# Patient Record
Sex: Female | Born: 1960 | Race: White | Hispanic: No | Marital: Single | Smoking: Never smoker
Health system: Southern US, Community
[De-identification: ages and names within clinical notes are randomized; demographics above are authoritative.]

## PROBLEM LIST (undated history)

## (undated) DIAGNOSIS — E079 Disorder of thyroid, unspecified: Secondary | ICD-10-CM

## (undated) DIAGNOSIS — I10 Essential (primary) hypertension: Secondary | ICD-10-CM

## (undated) HISTORY — PX: BREAST SURGERY: SHX581

## (undated) HISTORY — PX: ABDOMINAL HYSTERECTOMY: SHX81

## (undated) HISTORY — PX: SEPTOPLASTY: SHX2393

---

## 2019-07-09 ENCOUNTER — Other Ambulatory Visit: Payer: Self-pay

## 2019-07-09 ENCOUNTER — Encounter (HOSPITAL_BASED_OUTPATIENT_CLINIC_OR_DEPARTMENT_OTHER): Payer: Self-pay | Admitting: *Deleted

## 2019-07-09 ENCOUNTER — Emergency Department (HOSPITAL_BASED_OUTPATIENT_CLINIC_OR_DEPARTMENT_OTHER): Payer: Medicare HMO

## 2019-07-09 ENCOUNTER — Emergency Department (HOSPITAL_BASED_OUTPATIENT_CLINIC_OR_DEPARTMENT_OTHER)
Admission: EM | Admit: 2019-07-09 | Discharge: 2019-07-09 | Disposition: A | Discharge status: Home / Self Care | Payer: Medicare HMO | Attending: Emergency Medicine | Admitting: Emergency Medicine

## 2019-07-09 DIAGNOSIS — Z79899 Other long term (current) drug therapy: Secondary | ICD-10-CM | POA: Insufficient documentation

## 2019-07-09 DIAGNOSIS — I10 Essential (primary) hypertension: Secondary | ICD-10-CM | POA: Insufficient documentation

## 2019-07-09 DIAGNOSIS — J029 Acute pharyngitis, unspecified: Secondary | ICD-10-CM | POA: Diagnosis not present

## 2019-07-09 DIAGNOSIS — Z888 Allergy status to other drugs, medicaments and biological substances status: Secondary | ICD-10-CM | POA: Diagnosis not present

## 2019-07-09 DIAGNOSIS — E860 Dehydration: Secondary | ICD-10-CM | POA: Diagnosis not present

## 2019-07-09 DIAGNOSIS — R110 Nausea: Secondary | ICD-10-CM | POA: Diagnosis not present

## 2019-07-09 DIAGNOSIS — R197 Diarrhea, unspecified: Secondary | ICD-10-CM | POA: Diagnosis present

## 2019-07-09 DIAGNOSIS — E079 Disorder of thyroid, unspecified: Secondary | ICD-10-CM | POA: Diagnosis not present

## 2019-07-09 DIAGNOSIS — Z7982 Long term (current) use of aspirin: Secondary | ICD-10-CM | POA: Insufficient documentation

## 2019-07-09 DIAGNOSIS — Z881 Allergy status to other antibiotic agents status: Secondary | ICD-10-CM | POA: Insufficient documentation

## 2019-07-09 HISTORY — DX: Disorder of thyroid, unspecified: E07.9

## 2019-07-09 HISTORY — DX: Essential (primary) hypertension: I10

## 2019-07-09 LAB — CBC WITH DIFFERENTIAL/PLATELET
Abs Immature Granulocytes: 0.03 10*3/uL (ref 0.00–0.07)
Basophils Absolute: 0.1 10*3/uL (ref 0.0–0.1)
Basophils Relative: 1 %
Eosinophils Absolute: 0.2 10*3/uL (ref 0.0–0.5)
Eosinophils Relative: 4 %
HCT: 47.1 % — ABNORMAL HIGH (ref 36.0–46.0)
Hemoglobin: 15 g/dL (ref 12.0–15.0)
Immature Granulocytes: 1 %
Lymphocytes Relative: 21 %
Lymphs Abs: 1.2 10*3/uL (ref 0.7–4.0)
MCH: 29.6 pg (ref 26.0–34.0)
MCHC: 31.8 g/dL (ref 30.0–36.0)
MCV: 92.9 fL (ref 80.0–100.0)
Monocytes Absolute: 0.5 10*3/uL (ref 0.1–1.0)
Monocytes Relative: 10 %
Neutro Abs: 3.7 10*3/uL (ref 1.7–7.7)
Neutrophils Relative %: 63 %
Platelets: 174 10*3/uL (ref 150–400)
RBC: 5.07 MIL/uL (ref 3.87–5.11)
RDW: 14.6 % (ref 11.5–15.5)
WBC: 5.7 10*3/uL (ref 4.0–10.5)
nRBC: 0 % (ref 0.0–0.2)

## 2019-07-09 LAB — COMPREHENSIVE METABOLIC PANEL
ALT: 26 U/L (ref 0–44)
AST: 30 U/L (ref 15–41)
Albumin: 4.3 g/dL (ref 3.5–5.0)
Alkaline Phosphatase: 98 U/L (ref 38–126)
Anion gap: 9 (ref 5–15)
BUN: 12 mg/dL (ref 6–20)
CO2: 24 mmol/L (ref 22–32)
Calcium: 9.4 mg/dL (ref 8.9–10.3)
Chloride: 111 mmol/L (ref 98–111)
Creatinine, Ser: 0.95 mg/dL (ref 0.44–1.00)
GFR calc Af Amer: >60 mL/min (ref >60)
GFR calc non Af Amer: >60 mL/min (ref >60)
Glucose, Bld: 73 mg/dL (ref 70–99)
Potassium: 3.8 mmol/L (ref 3.5–5.1)
Sodium: 144 mmol/L (ref 135–145)
Total Bilirubin: 0.5 mg/dL (ref 0.3–1.2)
Total Protein: 7.1 g/dL (ref 6.5–8.1)

## 2019-07-09 LAB — LIPASE, BLOOD: Lipase: 28 U/L (ref 11–51)

## 2019-07-09 MED ORDER — PROMETHAZINE HCL 25 MG/ML IJ SOLN
25.0000 mg | Freq: Once | INTRAMUSCULAR | Status: AC
  Administered 2019-07-09: 25 mg via INTRAMUSCULAR
  Filled 2019-07-09: qty 1

## 2019-07-09 MED ORDER — PROMETHAZINE HCL 25 MG/ML IJ SOLN
12.5000 mg | Freq: Once | INTRAMUSCULAR | Status: AC
  Administered 2019-07-09: 21:00:00 12.5 mg via INTRAMUSCULAR
  Filled 2019-07-09: qty 1

## 2019-07-09 MED ORDER — SODIUM CHLORIDE 0.9 % IV BOLUS
1000.0000 mL | Freq: Once | INTRAVENOUS | Status: AC
  Administered 2019-07-09: 19:00:00 1000 mL via INTRAVENOUS

## 2019-07-09 NOTE — ED Provider Notes (Signed)
[facility] EMERGENCY DEPARTMENT Provider Note   CSN: [id number] Arrival date & time: 07/09/19  1746     History   Chief Complaint Chief Complaint  Patient presents with  . Diarrhea    HPI Rebecca Stein is a 58 y.o. female.     The history is provided by the patient and medical records. No language interpreter was used.  Diarrhea  Rebecca Stein is a 58 y.o. female who presents to the Emergency Department complaining of nausea and diarrhea. She presents to the ED complaining of four days of scratchy throat, nausea and diarrhea. No reports of fevers. She complains of watery stools up to 16 stools today. She also has a right-sided abdominal pain. She does have a history of recurrent right-sided abdominal pain and has been worked up for biliary problems in the past. She states that she had a possible COVID-19 exposure from her fianc. She just learned yesterday that he had a positive antibody on a test performed one month ago. Otherwise no clear COVID-19 exposures. She went to urgent care today and had a COVID swab performed, results are not back yet. She was referred to the emergency department for possible dehydration. She denies any chest pain. She has minimal cough. No vomiting. She has very poor appetite. Past Medical History:  Diagnosis Date  . Hypertension   . Thyroid disease     There are no active problems to display for this patient.   Past Surgical History:  Procedure Laterality Date  . ABDOMINAL HYSTERECTOMY    . BREAST SURGERY    . SEPTOPLASTY       OB History   No obstetric history on file.      Home Medications    Prior to Admission medications   Medication Sig Start Date End Date Taking? Authorizing Provider  alendronate (FOSAMAX) 70 MG tablet Take by mouth. 12/30/18  Yes [provider]  amLODipine (NORVASC) 2.5 MG tablet Take by mouth. 12/30/18  Yes [provider]  atorvastatin (LIPITOR) 80 MG tablet TAKE 1/2 TABLET EVERY DAY  12/30/18  Yes [provider]  celecoxib (CELEBREX) 200 MG capsule TAKE 1 CAPSULE TWICE DAILY 12/30/18  Yes [provider]  colchicine 0.6 MG tablet Take by mouth. 12/30/18  Yes [provider]  metoprolol succinate (TOPROL-XL) 25 MG 24 hr tablet TAKE 1 TABLET TWICE DAILY 05/27/14  Yes [provider]  nortriptyline (PAMELOR) 10 MG capsule TAKE 1 TO 3 CAPSULES NIGHTLY AS NEEDED 12/30/18  Yes [provider]  oxyCODONE-acetaminophen (PERCOCET) 10-325 MG tablet Take by mouth. 06/27/19  Yes [provider]  SUMAtriptan 6 MG/0.5ML SOAJ INJECT 0.5MLS (6MG  TOTAL) INTO THE SKIN EVERY 2 (TWO) HOURS AS NEEDED FOR MIGRAINE DO NOT EXCEED 2 DOSES IN A DAY 12/30/18  Yes [provider]  topiramate (TOPAMAX) 50 MG tablet Take by mouth. 12/30/18  Yes [provider]  venlafaxine XR (EFFEXOR-XR) 150 MG 24 hr capsule TAKE 1 CAPSULE EVERY DAY 12/30/18  Yes [provider]  aspirin 81 MG chewable tablet Chew by mouth.    [provider]  cyclobenzaprine (FLEXERIL) 5 MG tablet Take by mouth.    [provider]  esomeprazole (NEXIUM) 20 MG capsule Take by mouth.    [provider]  gabapentin (NEURONTIN) 300 MG capsule Take by mouth.    [provider]  LYSINE PO Take by mouth.    [provider]  rOPINIRole (REQUIP) 0.5 MG tablet Take by mouth.  [provider]    Family History History reviewed. No pertinent family history.  Social History Social History   Tobacco Use  . Smoking status: Never Smoker  . Smokeless tobacco: Never Used  Substance Use Topics  . Alcohol use: Not Currently  . Drug use: Not Currently     Allergies   Doxycycline and Ondansetron   Review of Systems Review of Systems  Gastrointestinal: Positive for diarrhea.  All other systems reviewed and are negative.    Physical Exam Updated Vital Signs BP (!) 145/95 (BP Location: Right Arm)   Pulse 94    Temp 98 F (36.7 C) (Oral)   Resp 18   Ht 5\' 6"  (1.676 m)   Wt 97.5 kg   SpO2 99%   BMI 34.70 kg/m   Physical Exam Vitals signs and nursing note reviewed.  Constitutional:      Appearance: She is well-developed.  HENT:     Head: Normocephalic and atraumatic.  Cardiovascular:     Rate and Rhythm: Regular rhythm. Tachycardia present.  Pulmonary:     Effort: Pulmonary effort is normal. No respiratory distress.     Breath sounds: Normal breath sounds.  Abdominal:     Palpations: Abdomen is soft.     Tenderness: There is no guarding or rebound.     Comments: Mild right sided abdominal tenderness  Musculoskeletal:        General: No swelling or tenderness.  Skin:    General: Skin is warm and dry.     Capillary Refill: Capillary refill takes less than 2 seconds.  Neurological:     Mental Status: She is alert and oriented to person, place, and time.  Psychiatric:        Behavior: Behavior normal.      ED Treatments / Results  Labs (all labs ordered are listed, but only abnormal results are displayed) Labs Reviewed  CBC WITH DIFFERENTIAL/PLATELET - Abnormal; Notable for the following components:      Result Value   HCT 47.1 (*)    All other components within normal limits  COMPREHENSIVE METABOLIC PANEL  LIPASE, BLOOD  URINALYSIS, ROUTINE W REFLEX MICROSCOPIC    EKG EKG Interpretation  Date/Time:  Thursday July 09 2019 19:17:04 EDT Ventricular Rate:  92 PR Interval:    QRS Duration: 85 QT Interval:  353 QTC Calculation: 437 R Axis:   9 Text Interpretation:  Sinus rhythm Low voltage, precordial leads Probable anteroseptal infarct, old no prior available for comparison Confirmed by Tilden Fossaees, Lancer Thurner [phone]([id number]) on 07/09/2019 7:35:09 PM   Radiology Dg Chest Port 1 View  Result Date: 07/09/2019 CLINICAL DATA:  Right-sided chest pain and cough. Nausea and diarrhea for 1 week. EXAM: PORTABLE CHEST 1 VIEW COMPARISON:  04/21/2017 FINDINGS: The cardiomediastinal  silhouette is within normal limits. Lung volumes are lower than on the prior study. No airspace consolidation, edema, pleural effusion, pneumothorax is identified. Suture anchors are noted in the right humeral head. No acute osseous abnormality is seen. IMPRESSION: No active disease. Electronically Signed   By: Sebastian AcheAllen  Grady M.D.   On: 07/09/2019 19:27    Procedures Procedures (including critical care time)  Medications Ordered in ED Medications  sodium chloride 0.9 % bolus 1,000 mL (0 mLs Intravenous Stopped 07/09/19 2104)  promethazine (PHENERGAN) injection 25 mg (25 mg Intramuscular Given 07/09/19 1920)  promethazine (PHENERGAN) injection 12.5 mg (12.5 mg Intramuscular Given 07/09/19 2105)     Initial Impression / Assessment and Plan / ED Course  I have reviewed  the triage vital signs and the nursing notes.  Pertinent labs & imaging results that were available during my care of the patient were reviewed by me and considered in my medical decision making (see chart for details).        Patient here for evaluation of nausea, diarrhea. She is concern for possible COVID-19 infection and did have a swab sent today, results are not available. She presents for ongoing diarrhea and feeling dehydrated. She is non-toxic appearing on evaluation. She was treated with Phenergan, IV fluids. Labs are without acute abnormalities. On repeat assessment she is feeling improved. No vomiting during her ED stay. Presentation is not consistent with acute diverticulitis, sepsis. Counseled patient on home care for diarrhea, dehydration. Discussed outpatient follow-up and return precautions.  Rebecca Stein was evaluated in Emergency Department on 07/09/2019 for the symptoms described in the history of present illness. She was evaluated in the context of the global COVID-19 pandemic, which necessitated consideration that the patient might be at risk for infection with the SARS-CoV-2 virus that causes COVID-19.  Institutional protocols and algorithms that pertain to the evaluation of patients at risk for COVID-19 are in a state of rapid change based on information released by regulatory bodies including the CDC and federal and state organizations. These policies and algorithms were followed during the patient's care in the ED.   Final Clinical Impressions(s) / ED Diagnoses   Final diagnoses:  Diarrhea, unspecified type  Dehydration    ED Discharge Orders    None       Tilden Fossa, MD 07/09/19 2346

## 2019-07-09 NOTE — ED Triage Notes (Signed)
Pt c/o n/d x 1 week , sent here from PMD for eval, Covid test done today

## 2020-04-02 IMAGING — DX DG CHEST 1V PORT
1 series · 1 of 1 positions shown · non-contrast
Comparison: 04/21/2017

CLINICAL DATA: Right-sided chest pain and cough. Nausea and
diarrhea for 1 week.

EXAM:
PORTABLE CHEST 1 VIEW

[chest ap]
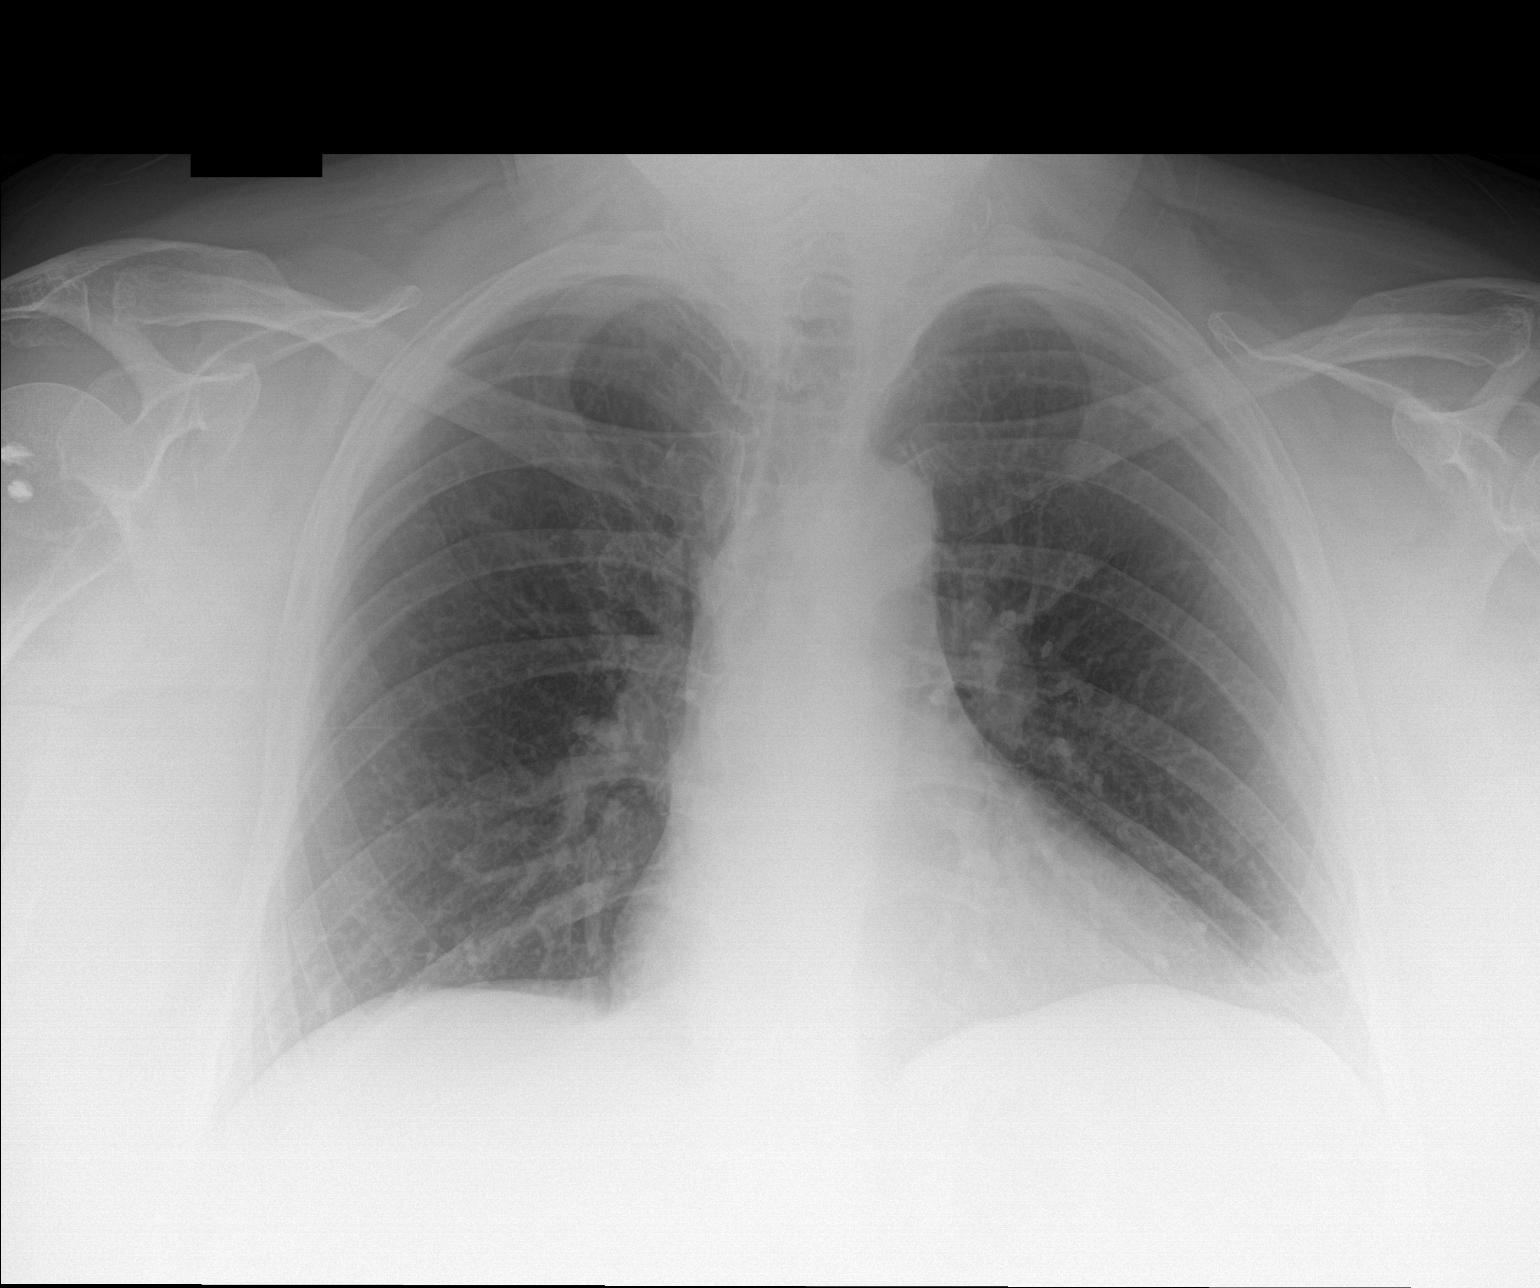

[1 of 1 positions shown; findings below may reference images not displayed]

FINDINGS: The cardiomediastinal silhouette is within normal limits. Lung
volumes are lower than on the prior study. No airspace
consolidation, edema, pleural effusion, pneumothorax is identified.
Suture anchors are noted in the right humeral head. No acute osseous
abnormality is seen.
IMPRESSION: No active disease.
